# Patient Record
Sex: Male | Born: 1985 | Race: White | Hispanic: No | Smoking: Never smoker
Health system: Southern US, Community
[De-identification: ages and names within clinical notes are randomized; demographics above are authoritative.]

---
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
## 2019-05-28 ENCOUNTER — Encounter (HOSPITAL_BASED_OUTPATIENT_CLINIC_OR_DEPARTMENT_OTHER): Payer: Self-pay

## 2019-05-28 ENCOUNTER — Other Ambulatory Visit: Payer: Self-pay

## 2019-05-28 ENCOUNTER — Emergency Department (HOSPITAL_BASED_OUTPATIENT_CLINIC_OR_DEPARTMENT_OTHER)
Admission: EM | Admit: 2019-05-28 | Discharge: 2019-05-29 | Disposition: A | Discharge status: Home / Self Care | Payer: Self-pay | Attending: Emergency Medicine | Admitting: Emergency Medicine

## 2019-05-28 DIAGNOSIS — Z20822 Contact with and (suspected) exposure to covid-19: Secondary | ICD-10-CM | POA: Insufficient documentation

## 2019-05-28 DIAGNOSIS — J029 Acute pharyngitis, unspecified: Secondary | ICD-10-CM | POA: Insufficient documentation

## 2019-05-28 DIAGNOSIS — R0982 Postnasal drip: Secondary | ICD-10-CM | POA: Insufficient documentation

## 2019-05-28 LAB — GROUP A STREP BY PCR: Group A Strep by PCR: NOT DETECTED

## 2019-05-28 LAB — SARS CORONAVIRUS 2 AG (30 MIN TAT): SARS Coronavirus 2 Ag: NEGATIVE
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
## 2019-05-28 NOTE — ED Notes (Signed)
Patient talking in complete sentences without difficulty. Patient has no audible stridor RR WNL.

## 2019-05-29 NOTE — ED Provider Notes (Signed)
[facility] EMERGENCY DEPARTMENT Provider Note  CSN: [id number] Arrival date & time: 05/28/19 2122  Chief Complaint(s) Sore Throat  HPI Keith Cooley is a 34 y.o. male   The history is provided by the patient.  Sore Throat This is a new problem. The current episode started yesterday. The problem occurs constantly. The problem has not changed since onset.Pertinent negatives include no chest pain, no abdominal pain, no headaches and no shortness of breath. Associated symptoms comments: Noted nasal congestion this morning. The symptoms are aggravated by swallowing. Nothing relieves the symptoms. He has tried nothing for the symptoms.    Smelled mold at his house. Worked with dust and fiberglass  Past Medical History History reviewed. No pertinent past medical history. There are no problems to display for this patient.  Home Medication(s) Prior to Admission medications   Not on File                                                                                                                                    Past Surgical History History reviewed. No pertinent surgical history. Family History No family history on file.  Social History Social History   Tobacco Use  . Smoking status: Never Smoker  Substance Use Topics  . Alcohol use: Never  . Drug use: Never   Allergies Patient has no allergy information on record.  Review of Systems Review of Systems  Respiratory: Negative for shortness of breath.   Cardiovascular: Negative for chest pain.  Gastrointestinal: Negative for abdominal pain.  Neurological: Negative for headaches.   All other systems are reviewed and are negative for acute change except as noted in the HPI  Physical Exam Vital Signs  I have reviewed the triage vital signs BP (!) 141/92 (BP Location: Right Arm)   Pulse 77   Temp 98.4 F (36.9 C) (Oral)   Resp 18   Ht 5\' 11"  (1.803 m)   Wt 108.9 kg   SpO2 100%   BMI 33.47 kg/m    Physical Exam Vitals reviewed.  Constitutional:      General: He is not in acute distress.    Appearance: He is well-developed. He is not diaphoretic.  HENT:     Head: Normocephalic and atraumatic.     Jaw: No trismus.     Right Ear: External ear normal.     Left Ear: External ear normal.     Nose: Nose normal.     Mouth/Throat:     Pharynx: No pharyngeal swelling, oropharyngeal exudate, posterior oropharyngeal erythema or uvula swelling.     Tonsils: No tonsillar exudate.     Comments: Post nasal drip with mild cobblestoning  Eyes:     General: No scleral icterus.    Conjunctiva/sclera: Conjunctivae normal.  Neck:     Trachea: Phonation normal.  Cardiovascular:     Rate and Rhythm: Normal rate and regular rhythm.  Pulmonary:  Effort: Pulmonary effort is normal. No respiratory distress.     Breath sounds: No stridor.  Abdominal:     General: There is no distension.  Musculoskeletal:        General: Normal range of motion.     Cervical back: Normal range of motion.  Lymphadenopathy:     Cervical: No cervical adenopathy.  Neurological:     Mental Status: He is alert and oriented to person, place, and time.  Psychiatric:        Behavior: Behavior normal.     ED Results and Treatments Labs (all labs ordered are listed, but only abnormal results are displayed) Labs Reviewed  SARS CORONAVIRUS 2 AG (30 MIN TAT)  GROUP A STREP BY PCR                                                                                                                         EKG  EKG Interpretation  Date/Time:    Ventricular Rate:    PR Interval:    QRS Duration:   QT Interval:    QTC Calculation:   R Axis:     Text Interpretation:        Radiology No results found.  Pertinent labs & imaging results that were available during my care of the patient were reviewed by me and considered in my medical decision making (see chart for details).  Medications Ordered in ED Medications  - No data to display                                                                                                                                  Procedures Procedures  (including critical care time)  Medical Decision Making / ED Course I have reviewed the nursing notes for this encounter and the patient's prior records (if available in EHR or on provided paperwork).   Keith Cooley was evaluated in Emergency Department on 05/29/2019 for the symptoms described in the history of present illness. He was evaluated in the context of the global COVID-19 pandemic, which necessitated consideration that the patient might be at risk for infection with the SARS-CoV-2 virus that causes COVID-19. Institutional protocols and algorithms that pertain to the evaluation of patients at risk for COVID-19 are in a state of rapid change based on information released by regulatory bodies including the CDC and federal and state organizations. These policies and algorithms were followed  during the patient's care in the ED.  Patient presented with sore throat. He is afebrile with stable vital signs.  Nontoxic and well-hydrated appearing. Exam not consistent with pharyngitis.  Rapid strep drawn in triage was negative.  Patient also had a rapid coronavirus test which was negative. Feel his presentation is likely related to allergies versus irritant.  Possible early infectious process but less likely.  Recommended symptomatic management with allergy medicine and over-the-counter meds.  The patient appears reasonably screened and/or stabilized for discharge and I doubt any other medical condition or other [facility] requiring further screening, evaluation, or treatment in the ED at this time prior to discharge.  The patient is safe for discharge with strict return precautions.     Final Clinical Impression(s) / ED Diagnoses Final diagnoses:  Postnasal drip  Sore throat    The patient appears reasonably screened  and/or stabilized for discharge and I doubt any other medical condition or other [facility] requiring further screening, evaluation, or treatment in the ED at this time prior to discharge.  Disposition: Discharge  Condition: Good  I have discussed the results, Dx and Tx plan with the patient who expressed understanding and agree(s) with the plan. Discharge instructions discussed at great length. The patient was given strict return precautions who verbalized understanding of the instructions. No further questions at time of discharge.    ED Discharge Orders    None      Follow Up: Primary care provider  Schedule an appointment as soon as possible for a visit  If you do not have a primary care physician, contact [facility] at [phone] for referral      This chart was dictated using voice recognition software.  Despite best efforts to proofread,  errors can occur which can change the documentation meaning.   Nira Conn, MD 05/29/19 [phone]

## 2021-09-11 ENCOUNTER — Encounter (HOSPITAL_BASED_OUTPATIENT_CLINIC_OR_DEPARTMENT_OTHER): Payer: Self-pay

## 2021-09-11 ENCOUNTER — Emergency Department (HOSPITAL_BASED_OUTPATIENT_CLINIC_OR_DEPARTMENT_OTHER)
Admission: EM | Admit: 2021-09-11 | Discharge: 2021-09-11 | Disposition: A | Discharge status: Home / Self Care | Payer: Self-pay | Attending: Emergency Medicine | Admitting: Emergency Medicine

## 2021-09-11 ENCOUNTER — Emergency Department (HOSPITAL_BASED_OUTPATIENT_CLINIC_OR_DEPARTMENT_OTHER): Payer: Self-pay

## 2021-09-11 DIAGNOSIS — M5442 Lumbago with sciatica, left side: Secondary | ICD-10-CM | POA: Insufficient documentation

## 2021-09-11 DIAGNOSIS — X500XXA Overexertion from strenuous movement or load, initial encounter: Secondary | ICD-10-CM | POA: Insufficient documentation

## 2021-09-11 DIAGNOSIS — M5441 Lumbago with sciatica, right side: Secondary | ICD-10-CM | POA: Insufficient documentation

## 2021-09-11 NOTE — Discharge Instructions (Addendum)
It was a pleasure taking care of you today!  ? ?Your CT was negative for fracture. You may take over the counter 600 mg ibuprofen every 6 hours or 500 mg tylenol every 6 hours as needed for pain.  You may apply ice or heat to the affected area for up to 15 minutes at a time.  Ensure to place a barrier between your skin and the ice or heat. Return to the Emergency Department if you are experiencing loss of bowel or bladder, increasing/worsening symptoms, fever, inability to walk. ?

## 2021-09-11 NOTE — ED Provider Notes (Signed)
?[facility] EMERGENCY DEPT ?Provider Note ? ? ?CSN: [id number] ?Arrival date & time: 09/11/21  1040 ? ?  ? ?History ? ?Chief Complaint  ?Patient presents with  ? Back Pain  ? ? ?Keith Cooley is a 36 y.o. male who was who presents to the ED complaining of sudden onset lower back pain occurred approximately 2 hours ago prior to arrival.  Patient notes that he was lifting a window unit out of his house.  And felt that he had a pinching sensation to his back which caused him to fall to his knees. He was was given over-the-counter muscle relaxer by his significant other with relief in symptoms.  Patient notes he has been able to ambulate since the incident.  Denies numbness, tingling, weakness. ? ?The history is provided by the patient. No language interpreter was used.  ? ?  ? ?Home Medications ?Prior to Admission medications   ?Not on File  ?   ? ?Allergies    ?Patient has no known allergies.   ? ?Review of Systems   ?Review of Systems  ?Constitutional:  Negative for fever.  ?Musculoskeletal:  Positive for back pain. Negative for gait problem.  ?Skin:  Negative for color change and wound.  ?Neurological:  Negative for weakness and numbness.  ?All other systems reviewed and are negative. ? ?Physical Exam ?Updated Vital Signs ?BP (!) 141/86 (BP Location: Right Arm)   Pulse 71   Temp 98 ?F (36.7 ?C)   Resp 16   SpO2 100%  ?Physical Exam ?Vitals and nursing note reviewed.  ?Constitutional:   ?   General: He is not in acute distress. ?   Appearance: He is not diaphoretic.  ?HENT:  ?   Head: Normocephalic and atraumatic.  ?   Mouth/Throat:  ?   Pharynx: No oropharyngeal exudate.  ?Eyes:  ?   General: No scleral icterus. ?   Conjunctiva/sclera: Conjunctivae normal.  ?Cardiovascular:  ?   Rate and Rhythm: Normal rate and regular rhythm.  ?   Pulses: Normal pulses.  ?   Heart sounds: Normal heart sounds.  ?Pulmonary:  ?   Effort: Pulmonary effort is normal. No respiratory distress.  ?   Breath sounds: Normal  breath sounds. No wheezing.  ?Abdominal:  ?   General: There is no distension.  ?Musculoskeletal:     ?   General: Normal range of motion.  ?   Cervical back: Normal range of motion and neck supple.  ?   Comments: Positive straight leg raise bilaterally, left greater than right.  Tenderness to palpation noted to lumbar paraspinal region. No overlying skin changes.   ?Skin: ?   General: Skin is warm and dry.  ?Neurological:  ?   Mental Status: He is alert.  ?Psychiatric:     ?   Behavior: Behavior normal.  ? ? ?ED Results / Procedures / Treatments   ?Labs ?(all labs ordered are listed, but only abnormal results are displayed) ?Labs Reviewed - No data to display ? ?EKG ?None ? ?Radiology ?CT Lumbar Spine Wo Contrast ? ?Result Date: 09/11/2021 ?CLINICAL DATA:  Sudden onset low back pain related to lifting injury EXAM: CT LUMBAR SPINE WITHOUT CONTRAST TECHNIQUE: Multidetector CT imaging of the lumbar spine was performed without intravenous contrast administration. Multiplanar CT image reconstructions were also generated. RADIATION DOSE REDUCTION: This exam was performed according to the departmental dose-optimization program which includes automated exposure control, adjustment of the mA and/or kV according to patient size and/or use of iterative  reconstruction technique. COMPARISON:  None. FINDINGS: Segmentation: 5 lumbar type vertebrae Alignment: Normal Vertebrae: No acute fracture or bone lesion. Paraspinal and other soft tissues: No visible hematoma or strain. Disc levels: Diffusely preserved disc height and hydration. 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 The spinal canal is diffusely patent IMPRESSION: 1. No acute finding. 2. 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 Electronically Signed   By: Tiburcio Pea M.D.   On: 09/11/2021 12:54   ? ?Procedures ?Procedures  ? ? ?Medications Ordered in ED ?Medications - No data to display ? ?ED Course/  Medical Decision Making/ A&P ?Clinical Course as of 09/11/21 1342  ?Wynelle Link Sep 11, 2021  ?1223 Offered patient medications in the ED, patient declined at this time.  Offered warm compress, patient would like a warm compress. [SB]  ?1334 Discussed CT findings with patient. Discussed discharge treatment plan. Offered prescription medications to which patient declined at this time. Answered all available questions. Pt appears safe for discharge. [SB]  ?  ?Clinical Course User Index ?[SB] Elianie Hubers A, PA-C  ? ?                        ?Medical Decision Making ?Amount and/or Complexity of Data Reviewed ?Radiology: ordered. ? ? ?Pt presents with lower back pain onset PTA status post lifting a window unit in his home. Vital signs stable, pt afebrile. On exam, patient with positive straight leg raise bilaterally, left greater than right.  Tenderness to palpation noted to lumbar paraspinal region. No overlying skin changes.  No spinal TTP. No acute cardiovascular or respiratory exam findings. Differential diagnosis includes fracture, dislocation, herniation, sciatica.  ? ?Imaging: ?I ordered imaging studies including CT lumbar ?I independently visualized and interpreted imaging which showed:  ?1. No acute finding.  ?2. L4-5 chronic left foraminal protrusion without high-grade  ?stenosis.  ? ?I agree with the radiologist interpretation ? ?Medications:  ?I ordered medication including warm compress for symptom management ?Reevaluation of the patient after these medicines and interventions, I reevaluated the patient and found that they have improved ?I have reviewed the patients home medicines and have made adjustments as needed ? ?Disposition: ?Pt presentation suspicious for sciatica, bilaterally. Doubt fracture, dislocation, or herniation at this time. After consideration of the diagnostic results and the patients response to treatment, I feel that the patient would benefit from Discharge home. Offered patient prescription  medications to which patient declined at this time. Supportive care measures and strict return precautions discussed with patient at bedside. Pt acknowledges and verbalizes understanding. Pt appears safe for discharge. Follow up as indicated in discharge paperwork.  ? ? ?This chart was dictated using voice recognition software, Dragon. Despite the best efforts of this provider to proofread and correct errors, errors may still occur which can change documentation meaning. ? ?Final Clinical Impression(s) / ED Diagnoses ?Final diagnoses:  ?Acute bilateral low back pain with bilateral sciatica  ? ? ?Rx / DC Orders ?ED Discharge Orders   ? ? None  ? ?  ? ? ?  ?Elizabeth Haff A, PA-C ?09/11/21 1342 ? ?  ?Sloan Leiter, DO ?09/12/21 1346 ? ?

## 2021-09-11 NOTE — ED Triage Notes (Signed)
He c/o sudden onset of low back pain upon lifting a "window unit" today. He denies any paresthesias/dysthesias of any extremity, including all toes. ?

## 2021-09-11 NOTE — ED Notes (Signed)
Discharge paperwork given and understood. 

## 2022-01-03 ENCOUNTER — Ambulatory Visit (HOSPITAL_BASED_OUTPATIENT_CLINIC_OR_DEPARTMENT_OTHER): Payer: Self-pay | Admitting: Family Medicine

## 2022-01-20 ENCOUNTER — Emergency Department (HOSPITAL_BASED_OUTPATIENT_CLINIC_OR_DEPARTMENT_OTHER)
Admission: EM | Admit: 2022-01-20 | Discharge: 2022-01-20 | Disposition: A | Discharge status: Home / Self Care | Payer: Medicaid Other | Attending: Emergency Medicine | Admitting: Emergency Medicine

## 2022-01-20 ENCOUNTER — Other Ambulatory Visit: Payer: Self-pay

## 2022-01-20 ENCOUNTER — Encounter (HOSPITAL_BASED_OUTPATIENT_CLINIC_OR_DEPARTMENT_OTHER): Payer: Self-pay

## 2022-01-20 DIAGNOSIS — L559 Sunburn, unspecified: Secondary | ICD-10-CM | POA: Insufficient documentation

## 2022-01-20 DIAGNOSIS — R21 Rash and other nonspecific skin eruption: Secondary | ICD-10-CM | POA: Insufficient documentation

## 2022-01-20 NOTE — ED Provider Notes (Signed)
[facility] EMERGENCY DEPT Provider Note  CSN: [id number] Arrival date & time: 01/20/22 1925  Chief Complaint(s) Rash  HPI Keith Cooley is a 36 y.o. male {Add pertinent medical, surgical, social history, OB history to HPI:1} windom tooth removed yesterday. Out in the sun   Rash Location:  Leg Leg rash location:  L upper leg and R upper leg Quality: redness   Severity:  Moderate Timing:  Constant Chronicity:  New Relieved by:  Nothing Worsened by:  Nothing Associated symptoms: no fatigue, no fever, no headaches, no induration, no myalgias, no nausea, no sore throat and not vomiting     Past Medical History History reviewed. No pertinent past medical history. There are no problems to display for this patient.  Home Medication(s) Prior to Admission medications   Not on File                                                                                                                                    Allergies Patient has no known allergies.  Review of Systems Review of Systems  Constitutional:  Negative for fatigue and fever.  HENT:  Negative for sore throat.   Gastrointestinal:  Negative for nausea and vomiting.  Musculoskeletal:  Negative for myalgias.  Skin:  Positive for rash.  Neurological:  Negative for headaches.   As noted in HPI  Physical Exam Vital Signs  I have reviewed the triage vital signs BP 127/76   Pulse 67   Temp 98.1 F (36.7 C) (Oral)   Resp 18   Ht 5\' 11"  (1.803 m)   Wt 102.7 kg   SpO2 99%   BMI 31.59 kg/m  *** Physical Exam Vitals reviewed.  Constitutional:      General: He is not in acute distress.    Appearance: He is well-developed. He is not diaphoretic.  HENT:     Head: Normocephalic and atraumatic.     Right Ear: External ear normal.     Left Ear: External ear normal.     Nose: Nose normal.     Mouth/Throat:     Mouth: Mucous membranes are moist.  Eyes:     General: No scleral icterus.     Conjunctiva/sclera: Conjunctivae normal.  Neck:     Trachea: Phonation normal.  Cardiovascular:     Rate and Rhythm: Normal rate and regular rhythm.  Pulmonary:     Effort: Pulmonary effort is normal. No respiratory distress.     Breath sounds: No stridor.  Abdominal:     General: There is no distension.  Musculoskeletal:        General: Normal range of motion.     Cervical back: Normal range of motion.  Skin:      Neurological:     Mental Status: He is alert and oriented to person, place, and time.  Psychiatric:        Behavior: Behavior normal.  ED Results and Treatments Labs (all labs ordered are listed, but only abnormal results are displayed) Labs Reviewed - No data to display                                                                                                                       EKG  EKG Interpretation  Date/Time:    Ventricular Rate:    PR Interval:    QRS Duration:   QT Interval:    QTC Calculation:   R Axis:     Text Interpretation:         Radiology No results found.  Medications Ordered in ED Medications - No data to display                                                                                                                                   Procedures Procedures  (including critical care time)  Medical Decision Making / ED Course   Medical Decision Making         Final Clinical Impression(s) / ED Diagnoses Final diagnoses:  None    {Document critical care time when appropriate:1}  {Document review of labs and clinical decision tools ie heart score, Chads2Vasc2 etc:1}  {Document your independent review of radiology images, and any outside records:1} {Document your discussion with family members, caretakers, and with consultants:1} {Document social determinants of health affecting pt's care:1} {Document your decision making why or why not admission, treatments were needed:1} This chart was dictated using  voice recognition software.  Despite best efforts to proofread,  errors can occur which can change the documentation meaning.

## 2022-01-20 NOTE — ED Triage Notes (Signed)
Reports left upper wisdom tooth extraction yesterday and got a nerve block prior to procedure.   Today has redness,around bilateral lower thighs. Warm to touch and describes it as a burning sensation.   Has not taken any pain management or new meds since.

## 2022-01-20 NOTE — ED Notes (Signed)
Reviewed AVS/discharge instruction with patient. Time allotted for and all questions answered. Patient is agreeable for d/c and escorted to ed exit by staff.  

## 2022-04-29 DIAGNOSIS — Z20828 Contact with and (suspected) exposure to other viral communicable diseases: Secondary | ICD-10-CM | POA: Diagnosis not present

## 2022-04-29 DIAGNOSIS — R509 Fever, unspecified: Secondary | ICD-10-CM | POA: Diagnosis not present

## 2022-07-16 ENCOUNTER — Encounter (HOSPITAL_BASED_OUTPATIENT_CLINIC_OR_DEPARTMENT_OTHER): Payer: Self-pay

## 2022-07-16 ENCOUNTER — Other Ambulatory Visit: Payer: Self-pay

## 2022-07-16 ENCOUNTER — Emergency Department (HOSPITAL_BASED_OUTPATIENT_CLINIC_OR_DEPARTMENT_OTHER)
Admission: EM | Admit: 2022-07-16 | Discharge: 2022-07-17 | Disposition: A | Discharge status: Home / Self Care | Payer: Medicaid Other | Attending: Emergency Medicine | Admitting: Emergency Medicine

## 2022-07-16 DIAGNOSIS — S62142A Displaced fracture of body of hamate [unciform] bone, left wrist, initial encounter for closed fracture: Secondary | ICD-10-CM | POA: Insufficient documentation

## 2022-07-16 DIAGNOSIS — S2242XA Multiple fractures of ribs, left side, initial encounter for closed fracture: Secondary | ICD-10-CM | POA: Diagnosis not present

## 2022-07-16 DIAGNOSIS — S20301A Unspecified superficial injuries of right front wall of thorax, initial encounter: Secondary | ICD-10-CM | POA: Diagnosis present
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
## 2022-07-17 ENCOUNTER — Emergency Department (HOSPITAL_BASED_OUTPATIENT_CLINIC_OR_DEPARTMENT_OTHER): Payer: Medicaid Other

## 2022-07-17 ENCOUNTER — Emergency Department (HOSPITAL_BASED_OUTPATIENT_CLINIC_OR_DEPARTMENT_OTHER): Payer: Medicaid Other | Admitting: Radiology

## 2022-07-17 DIAGNOSIS — Z0471 Encounter for examination and observation following alleged adult physical abuse: Secondary | ICD-10-CM | POA: Diagnosis not present

## 2022-07-17 DIAGNOSIS — S0990XA Unspecified injury of head, initial encounter: Secondary | ICD-10-CM | POA: Diagnosis not present

## 2022-07-17 DIAGNOSIS — S2242XA Multiple fractures of ribs, left side, initial encounter for closed fracture: Secondary | ICD-10-CM | POA: Diagnosis not present

## 2022-07-17 NOTE — ED Notes (Signed)
Patient transported to CT 
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
## 2022-09-24 DIAGNOSIS — H60391 Other infective otitis externa, right ear: Secondary | ICD-10-CM | POA: Diagnosis not present

## 2022-09-24 DIAGNOSIS — H6123 Impacted cerumen, bilateral: Secondary | ICD-10-CM | POA: Diagnosis not present

## 2023-02-13 ENCOUNTER — Telehealth: Payer: Self-pay

## 2023-02-13 NOTE — Telephone Encounter (Signed)
Medicaid Managed Care   Unsuccessful Outreach Note  02/13/2023 Name: DREYSON LEAMY MRN: [id number] DOB: 18-Jul-1985  Referred by: Pcp, No Reason for referral : No chief complaint on file.   An unsuccessful telephone outreach was attempted today. The patient was referred to the case management team for assistance with care management and care coordination.   Follow Up Plan: If patient returns call to provider office, please advise to call [employer]edded Care Management Care Guide Nicholes Rough* at [phone]*  Nicholes Rough, CMA Care Guide [employer]

## 2024-02-03 IMAGING — CT CT L SPINE W/O CM
3 of 4 series · 12 of 33 positions shown, 14 images · non-contrast
Comparison: None.

CLINICAL DATA: Sudden onset low back pain related to lifting injury



[Series 5: coronal bone · coronal · 0.36mm/px · 3 of 73 slices shown]
[im 15/73  bone]
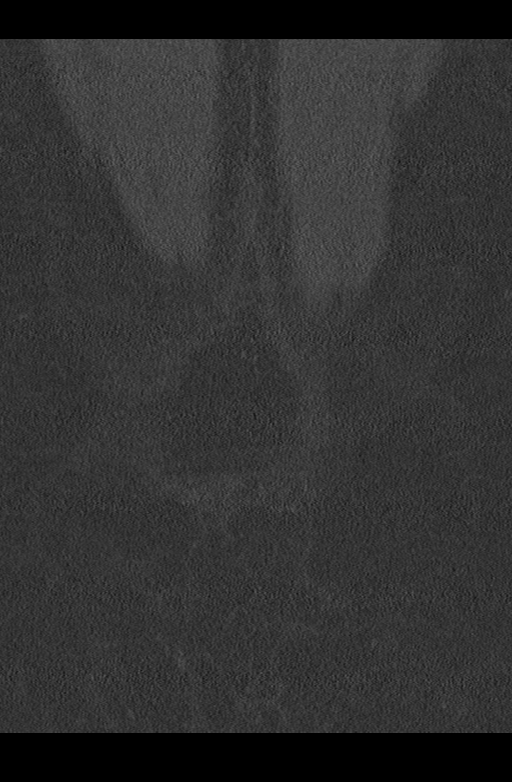
[im 29/73  bone]
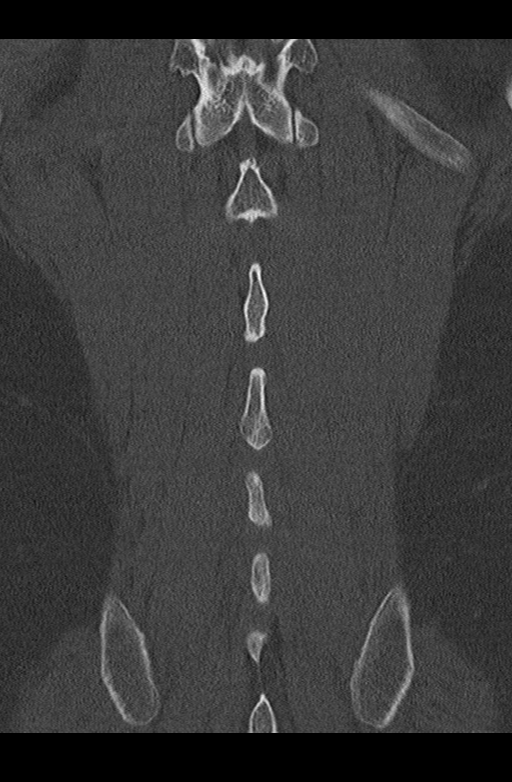
[im 44/73  bone]
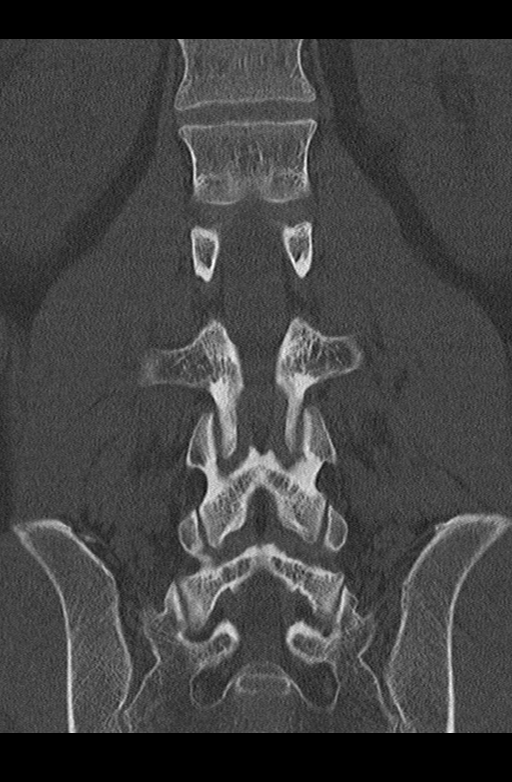

[Series 6: sagittal bone · sagittal · 0.33mm/px · 5 of 80 slices shown, 6 images]
[im 27/80  bone]
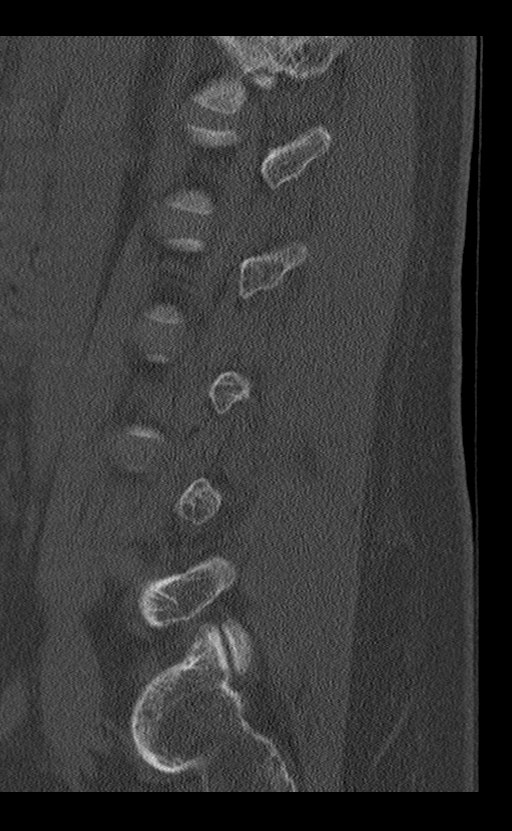
[im 33/80  bone]
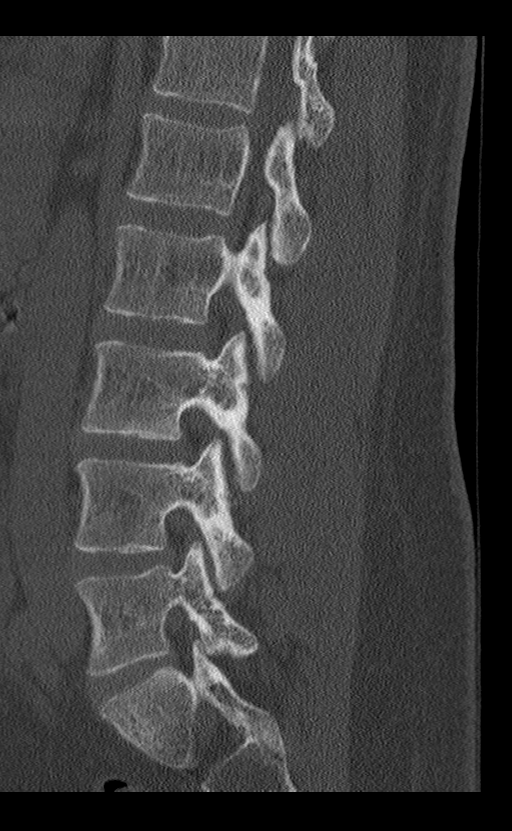
[im 40/80  soft-tissue]
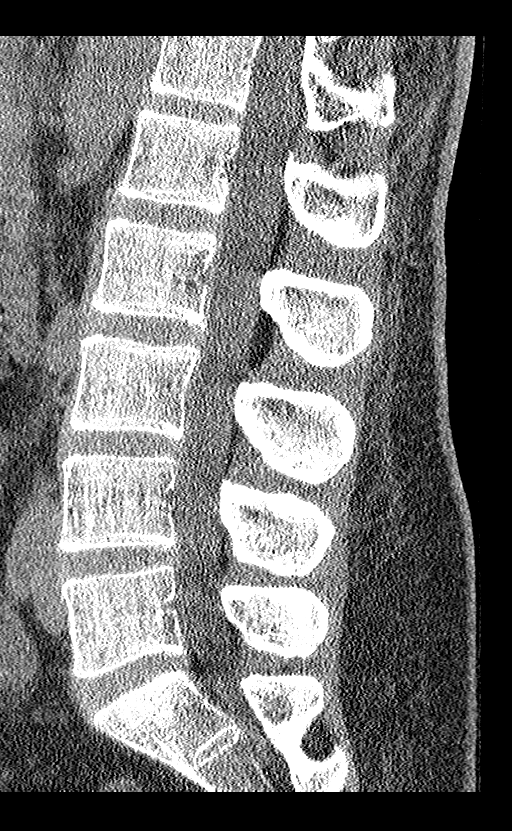
[im 40/80  bone]
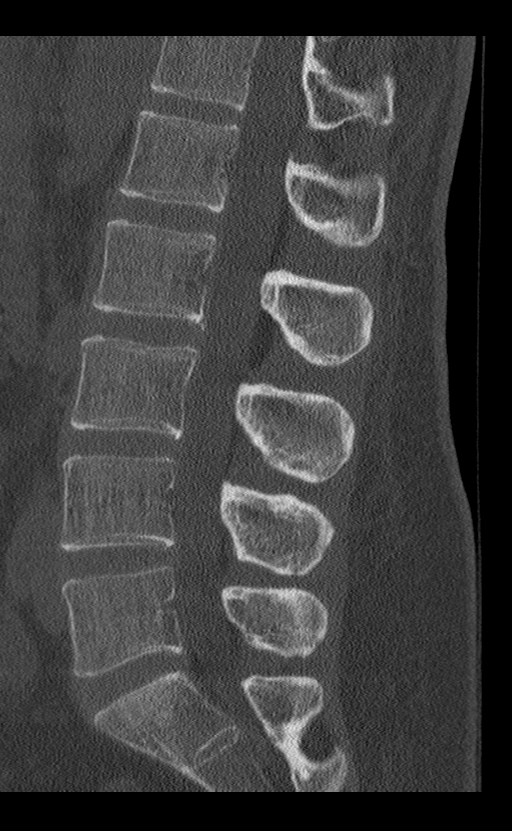
[im 47/80  bone]
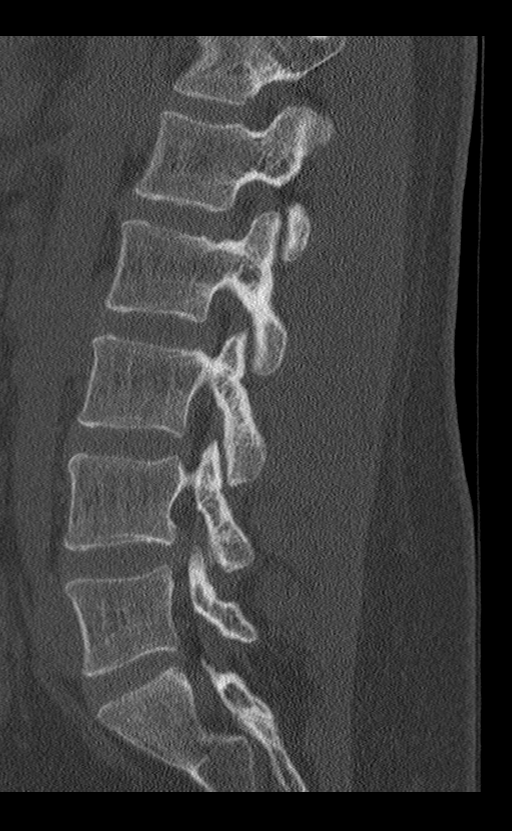
[im 53/80  bone]
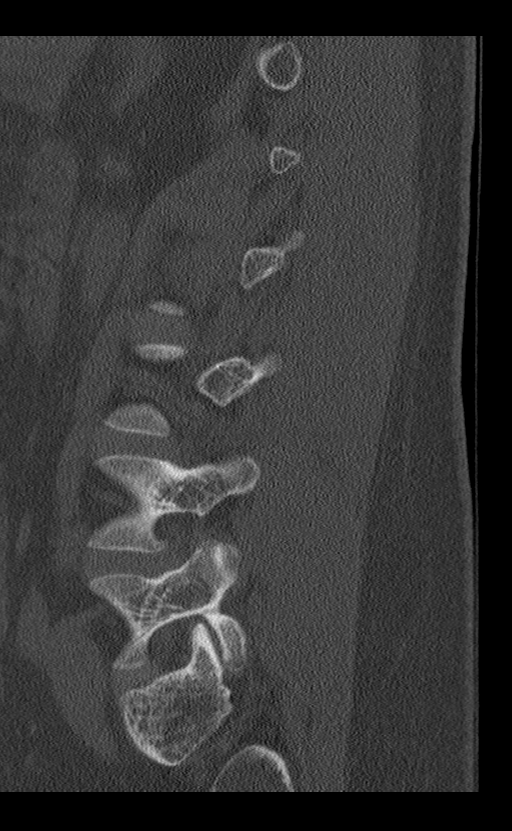

[Series 8: ax disc · axial · 0.21mm/px · z∈[-210,-31]mm · 4 of 118 slices shown, 5 images]
[im 20/118  soft-tissue]
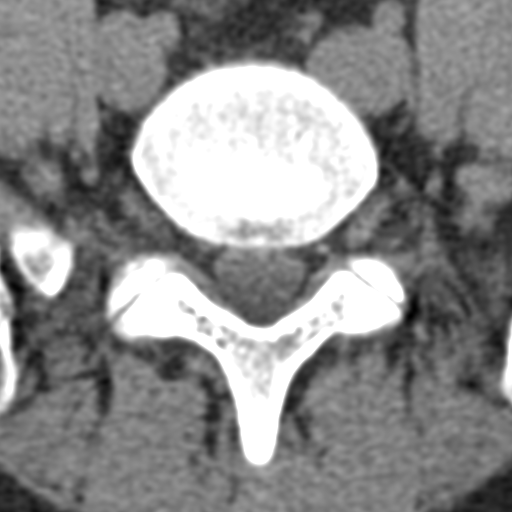
[im 20/118  bone]
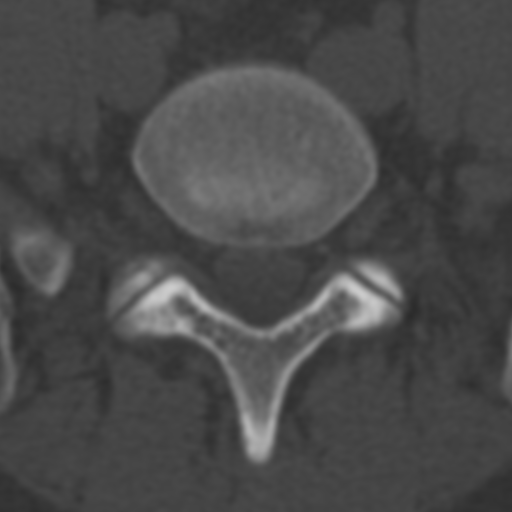
[im 40/118  bone]
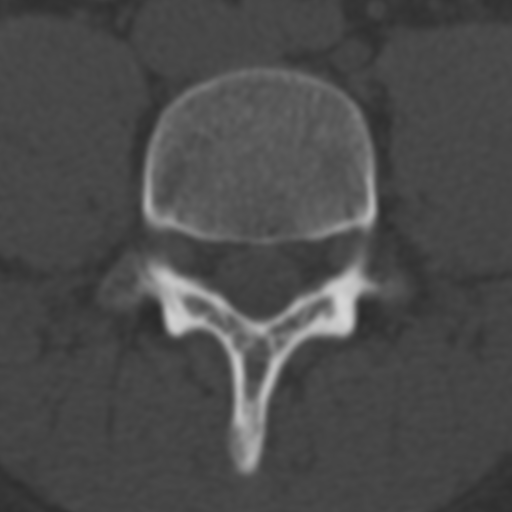
[im 79/118  bone]
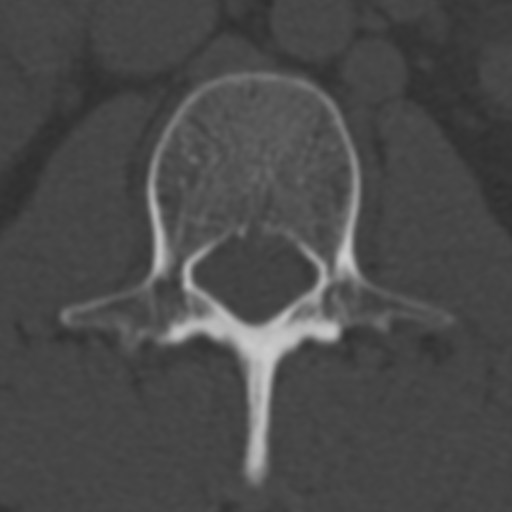
[im 98/118  bone]
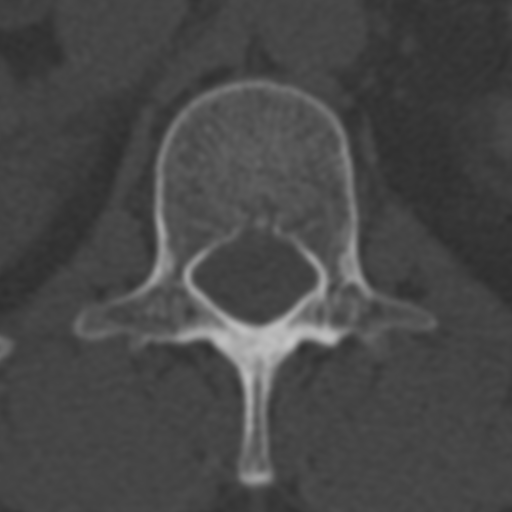

[12 of 33 positions shown; findings below may reference images not displayed]

FINDINGS: Segmentation: 5 lumbar type vertebrae

Alignment: Normal

Vertebrae: No acute fracture or bone lesion.

Paraspinal and other soft tissues: No visible hematoma or strain.

Disc levels: Diffusely preserved disc height and hydration. L4-5
chronic left foraminal protrusion with buttressing osteophytes,
noncompressive. The spinal canal is diffusely patent
IMPRESSION: 1. No acute finding.
2. L4-5 chronic left foraminal protrusion without high-grade
stenosis.
